# Patient Record
Sex: Male | Born: 1955 | Hispanic: No | Marital: Married | Smoking: Never smoker
Health system: Southern US, Community
[De-identification: ages and names within clinical notes are randomized; demographics above are authoritative.]

## PROBLEM LIST (undated history)

## (undated) DIAGNOSIS — K469 Unspecified abdominal hernia without obstruction or gangrene: Secondary | ICD-10-CM

## (undated) DIAGNOSIS — K08409 Partial loss of teeth, unspecified cause, unspecified class: Secondary | ICD-10-CM

---

## 2010-01-18 ENCOUNTER — Ambulatory Visit: Admit: 2010-01-18 | Payer: Self-pay | Admitting: Internal Medicine

## 2011-02-04 DIAGNOSIS — J309 Allergic rhinitis, unspecified: Secondary | ICD-10-CM | POA: Insufficient documentation

## 2011-03-26 ENCOUNTER — Ambulatory Visit: Payer: Self-pay | Admitting: Internal Medicine

## 2012-04-20 ENCOUNTER — Emergency Department (HOSPITAL_COMMUNITY)
Admission: EM | Admit: 2012-04-20 | Discharge: 2012-04-20 | Disposition: A | Discharge status: Home / Self Care | Payer: BC Managed Care – PPO | Attending: Emergency Medicine | Admitting: Emergency Medicine

## 2012-04-20 ENCOUNTER — Encounter (HOSPITAL_COMMUNITY): Payer: Self-pay

## 2012-04-20 ENCOUNTER — Emergency Department (HOSPITAL_COMMUNITY): Payer: BC Managed Care – PPO

## 2012-04-20 DIAGNOSIS — Z79899 Other long term (current) drug therapy: Secondary | ICD-10-CM | POA: Insufficient documentation

## 2012-04-20 DIAGNOSIS — K4090 Unilateral inguinal hernia, without obstruction or gangrene, not specified as recurrent: Secondary | ICD-10-CM | POA: Insufficient documentation

## 2012-04-20 DIAGNOSIS — R0682 Tachypnea, not elsewhere classified: Secondary | ICD-10-CM | POA: Insufficient documentation

## 2012-04-20 HISTORY — DX: Unspecified abdominal hernia without obstruction or gangrene: K46.9

## 2012-04-20 HISTORY — DX: Partial loss of teeth, unspecified cause, unspecified class: K08.409

## 2012-04-20 LAB — CBC
HCT: 42.6 % (ref 39.0–52.0)
Hemoglobin: 14.5 g/dL (ref 13.0–17.0)
MCH: 31 pg (ref 26.0–34.0)
MCHC: 34 g/dL (ref 30.0–36.0)
MCV: 91 fL (ref 78.0–100.0)
Platelets: 221 10*3/uL (ref 150–400)
RBC: 4.68 MIL/uL (ref 4.22–5.81)
RDW: 13.5 % (ref 11.5–15.5)
WBC: 6.9 10*3/uL (ref 4.0–10.5)

## 2012-04-20 LAB — BASIC METABOLIC PANEL
BUN: 16 mg/dL (ref 6–23)
CO2: 30 mEq/L (ref 19–32)
Calcium: 9.2 mg/dL (ref 8.4–10.5)
Chloride: 101 mEq/L (ref 96–112)
Creatinine, Ser: 1.04 mg/dL (ref 0.50–1.35)
GFR calc Af Amer: >90 mL/min (ref >90)
GFR calc non Af Amer: 78 mL/min — ABNORMAL LOW (ref >90)
Glucose, Bld: 113 mg/dL — ABNORMAL HIGH (ref 70–99)
Potassium: 3.4 mEq/L — ABNORMAL LOW (ref 3.5–5.1)
Sodium: 137 mEq/L (ref 135–145)

## 2012-04-20 LAB — URINALYSIS, ROUTINE W REFLEX MICROSCOPIC
Leukocytes, UA: NEGATIVE
Nitrite: NEGATIVE
Protein, ur: NEGATIVE mg/dL
Urobilinogen, UA: 0.2 mg/dL (ref 0.0–1.0)

## 2012-04-20 MED ORDER — IOHEXOL 300 MG/ML  SOLN
50.0000 mL | Freq: Once | INTRAMUSCULAR | Status: AC | PRN
  Administered 2012-04-20: 50 mL via ORAL

## 2012-04-20 MED ORDER — IOHEXOL 300 MG/ML  SOLN
100.0000 mL | Freq: Once | INTRAMUSCULAR | Status: AC | PRN
  Administered 2012-04-20: 100 mL via INTRAVENOUS

## 2012-04-20 MED ORDER — HYDROCODONE-ACETAMINOPHEN 5-325 MG PO TABS: ORAL_TABLET | ORAL | Status: DC

## 2012-04-20 MED ORDER — MORPHINE SULFATE 4 MG/ML IJ SOLN: 4.0000 mg | Freq: Once | INTRAMUSCULAR | Status: DC

## 2012-04-20 NOTE — ED Notes (Signed)
PA in to eval. Swelling to right side with little pain. Able to urinate and have bowel movements without difficulty

## 2012-04-20 NOTE — ED Provider Notes (Signed)
History     CSN: [id number]  Arrival date & time 04/20/12  1458   First MD Initiated Contact with Patient 04/20/12 1500      Chief Complaint  Patient presents with  . Groin Swelling    (Consider location/radiation/quality/duration/timing/severity/associated sxs/prior treatment) The history is provided by the patient. No language interpreter was used.  Pt is a 57yo male c/o right groin swelling and pain x5 days.  Pt reports mild stomach ache on Wednesday, took pepto-bismol that day, noticed swelling in right groin that night with increased discomfort.  Describes pain as dull and achy, made worse with certain movements, 1/10 on pain scale.  Does not recall any specific injury except possibly getting out of the car with a twisting motion. Reports hx of "small" hernia of unknown location 47yrs ago but denies any other abdominal hx.  No abdominal surgeries or GI problems.  Otherwise healthy male on no daily medications.  Has tried Advil, heat, and ice for current swelling and discomfort w/o relief.  Denies fever, n/v/d, dysuria, or constipation.  Denies testicular pain or swelling.   Past Medical History  Diagnosis Date  . Wisdom teeth removed   . Hernia     does not know where. childhood hernia    History reviewed. No pertinent past surgical history.  No family history on file.  History  Substance Use Topics  . Smoking status: Never Smoker   . Smokeless tobacco: Never Used  . Alcohol Use: No      Review of Systems  Constitutional: Negative for fever and chills.  Gastrointestinal: Negative for nausea, vomiting, abdominal pain, diarrhea, constipation and abdominal distention.  Genitourinary: Negative for dysuria, flank pain, discharge, scrotal swelling, enuresis, difficulty urinating and testicular pain.       Mass-right groin     Allergies  Review of patient's allergies indicates no known allergies.  Home Medications   Current Outpatient Rx  Name  Route  Sig  Dispense   Refill  . bismuth subsalicylate (PEPTO BISMOL) 262 MG/15ML suspension   Oral   Take 30 mLs by mouth every 6 (six) hours as needed for indigestion.         . Ginkgo Biloba (GINKOBA PO)   Oral   Take 1 capsule by mouth daily.         Marland Kitchen ibuprofen (ADVIL,MOTRIN) 200 MG tablet   Oral   Take 200 mg by mouth every 6 (six) hours as needed for pain.         . Multiple Vitamin (MULTIVITAMIN WITH MINERALS) TABS   Oral   Take 1 tablet by mouth daily.         Marland Kitchen HYDROcodone-acetaminophen (NORCO/VICODIN) 5-325 MG per tablet      Take 1-2 tablets every 6-8hrs as needed for pain.   6 tablet   0     BP 102/65  Pulse 62  Temp(Src) 98.1 F (36.7 C) (Oral)  Resp 16  SpO2 100%  Physical Exam  Nursing note and vitals reviewed. Constitutional: He appears well-developed and well-nourished.  Well appearing male, resting comfortably on exam bed.   HENT:  Head: Normocephalic and atraumatic.  Eyes: Conjunctivae are normal. No scleral icterus.  Neck: Normal range of motion.  Cardiovascular: Normal rate, regular rhythm and normal heart sounds.   Pulmonary/Chest: Effort normal and breath sounds normal.  tachypnic     Abdominal: Soft. Bowel sounds are normal. He exhibits mass ( right groin). He exhibits no distension. There is tenderness (right groin, across inguinal  ligament ). There is no rebound and no guarding. A hernia is present. Hernia confirmed positive in the right inguinal area ( indurated, non-reducible  ). Hernia confirmed negative in the left inguinal area.  Genitourinary: Testes normal and penis normal.    Right testis shows no mass, no swelling and no tenderness. Right testis is descended. Cremasteric reflex is not absent on the right side. Left testis shows no mass, no swelling and no tenderness. Left testis is descended. Cremasteric reflex is not absent on the left side.  Musculoskeletal: Normal range of motion.  Lymphadenopathy:       Right: No inguinal adenopathy present.        Left: No inguinal adenopathy present.  Neurological: He is alert.  Skin: Skin is warm and dry.    ED Course  Procedures (including critical care time)  Labs Reviewed  BASIC METABOLIC PANEL - Abnormal; Notable for the following:    Potassium 3.4 (*)    Glucose, Bld 113 (*)    GFR calc non Af Amer 78 (*)    All other components within normal limits  URINALYSIS, ROUTINE W REFLEX MICROSCOPIC  CBC   Ct Abdomen Pelvis W Contrast  04/20/2012  *RADIOLOGY REPORT*  Clinical Data: Right groin pain and swelling.  CT ABDOMEN AND PELVIS WITH CONTRAST  Technique:  Multidetector CT imaging of the abdomen and pelvis was performed following the standard protocol during bolus administration of intravenous contrast.  Contrast: OMNIPAQUE IOHEXOL 300 MG/ML  SOLN  Comparison: None  Findings: The lung bases are clear.  No pleural effusion.  The solid abdominal organs are normal.  The gallbladder is normal. No common bile duct dilatation.  There is a bilobed peripelvic and parenchymal left renal cyst which measures as simple fluid.  The stomach, duodenum, small bowel and colon are unremarkable.  The appendix is normal.  No mass lesions or inflammatory changes.  The aorta is normal in caliber.  The major branch vessels are normal. No mesenteric or retroperitoneal masses or adenopathy.  The bladder, prostate gland and seminal vesicles are normal.  No pelvic mass, adenopathy or free pelvic fluid collections.  There are a few small scattered lymph nodes.  There is a right inguinal hernia containing dirty fat and fluid. This could be incarcerated fat.  No significant bony findings.  There is a moderate scoliosis.  IMPRESSION:  1.  Large right inguinal hernia containing inflamed fat and fluid. Findings suspicious for incarceration.  It does not contain any bowel or scrotal contents. 2.  No other significant abdominal/pelvic findings.   Original Report Authenticated By: Rudie Meyer, M.D.      1. Right inguinal  hernia       MDM  Pt c/o right groin swelling and discomfort x5 days.  Hx of small hernia 61yrs ago but no abdominal surgeries or GI problems.  Denies fever, n/v/d, dysuria, constipation, or testicular pain.  Otherwise healthy male.  PE: abd: soft, non-distended, nl bowel sounds, TTP right groin with visible and palpable mass- indurated and non-reducible.  No testicular swelling or mass.   Concern for direct hernia.  Will obtain basic labs, abdominal CT, give morphine and make pt NPO.  Last ate some cereal earlier this morning.  General surgery consult put in by Dr. Manus Gunning.   UA: nl BMP: mild hypokalemia  CBC: nl  CT: large right inguinal hernia containing inflamed fat and fluid.  Does not contain any bowel or scrotal contents.    Will discharge pt home with  watchful waiting, likely need surgical repair down the road.  Advised pt to seek immediate medical attention should area become severely tender, pain radiates into testicles, notice mass in testicle, or develop fever and nausea with vomiting.  Will have pt f/u with [facility] for elective surgery.    Rx: norco  Provided pt with education packet on hernias.   Vitals: unremarkable. Discharged in stable condition.    Discussed pt with attending during ED encounter.      Junius Finner, PA-C 04/20/12 1810

## 2012-04-20 NOTE — ED Provider Notes (Signed)
Medical screening examination/treatment/procedure(s) were conducted as a shared visit with non-physician practitioner(s) and myself.  I personally evaluated the patient during the encounter  5 days of right groin pain with painful nodule. Incarcerated inguinal hernia on exam. No testicular pain. No abdominal pain. No bowel seen on imaging. Discussed with Dr. Magnus Ivan who agrees with pain control and outpatient followup.  Glynn Octave, MD 04/20/12 2325

## 2012-04-20 NOTE — ED Notes (Signed)
Patient woke up 5 days ago with stomach pain. Took pepto-bismal which helped. Went to work that day, came home and was having pain in right groin then wife noticed swelling about 2 hours later.

## 2014-10-03 ENCOUNTER — Emergency Department (HOSPITAL_COMMUNITY): Payer: BC Managed Care – PPO

## 2014-10-03 ENCOUNTER — Emergency Department (HOSPITAL_COMMUNITY)
Admission: EM | Admit: 2014-10-03 | Discharge: 2014-10-03 | Disposition: A | Discharge status: Home / Self Care | Payer: BC Managed Care – PPO | Attending: Emergency Medicine | Admitting: Emergency Medicine

## 2014-10-03 ENCOUNTER — Encounter (HOSPITAL_COMMUNITY): Payer: Self-pay | Admitting: *Deleted

## 2014-10-03 DIAGNOSIS — S90932A Unspecified superficial injury of left great toe, initial encounter: Secondary | ICD-10-CM | POA: Diagnosis not present

## 2014-10-03 DIAGNOSIS — Y998 Other external cause status: Secondary | ICD-10-CM | POA: Diagnosis not present

## 2014-10-03 DIAGNOSIS — Y9289 Other specified places as the place of occurrence of the external cause: Secondary | ICD-10-CM | POA: Insufficient documentation

## 2014-10-03 DIAGNOSIS — Y9389 Activity, other specified: Secondary | ICD-10-CM | POA: Diagnosis not present

## 2014-10-03 DIAGNOSIS — L02612 Cutaneous abscess of left foot: Secondary | ICD-10-CM

## 2014-10-03 DIAGNOSIS — S99922A Unspecified injury of left foot, initial encounter: Secondary | ICD-10-CM

## 2014-10-03 DIAGNOSIS — X58XXXA Exposure to other specified factors, initial encounter: Secondary | ICD-10-CM | POA: Insufficient documentation

## 2014-10-03 DIAGNOSIS — Z79899 Other long term (current) drug therapy: Secondary | ICD-10-CM | POA: Insufficient documentation

## 2014-10-03 MED ORDER — CEPHALEXIN 500 MG PO CAPS: 500.0000 mg | ORAL_CAPSULE | Freq: Four times a day (QID) | ORAL | Status: DC

## 2014-10-03 NOTE — Discharge Instructions (Signed)
Abscess An abscess (boil or furuncle) is an infected area on or under the skin. This area is filled with yellowish-white fluid (pus) and other material (debris). HOME CARE   Only take medicines as told by your doctor.  If you were given antibiotic medicine, take it as directed. Finish the medicine even if you start to feel better.  If gauze is used, follow your doctor's directions for changing the gauze.  To avoid spreading the infection:  Keep your abscess covered with a bandage.  Wash your hands well.  Do not share personal care items, towels, or whirlpools with others.  Avoid skin contact with others.  Keep your skin and clothes clean around the abscess.  Keep all doctor visits as told. GET HELP RIGHT AWAY IF:   You have more pain, puffiness (swelling), or redness in the wound site.  You have more fluid or blood coming from the wound site.  You have muscle aches, chills, or you feel sick.  You have a fever. MAKE SURE YOU:  Cellulitis Cellulitis is an infection of the skin and the tissue under the skin. The infected area is usually red and tender. This happens most often in the arms and lower legs. HOME CARE   Take your antibiotic medicine as told. Finish the medicine even if you start to feel better.  Keep the infected arm or leg raised (elevated).  Put a warm cloth on the area up to 4 times per day.  Only take medicines as told by your doctor.  Keep all doctor visits as told. GET HELP IF:  You see red streaks on the skin coming from the infected area.  Your red area gets bigger or turns a dark color.  Your bone or joint under the infected area is painful after the skin heals.  Your infection comes back in the same area or different area.  You have a puffy (swollen) bump in the infected area.  You have new symptoms.  You have a fever. GET HELP RIGHT AWAY IF:   You feel very sleepy.  You throw up (vomit) or have watery poop (diarrhea).  You feel  sick and have muscle aches and pains. MAKE SURE YOU:   Understand these instructions.  Will watch your condition.  Will get help right away if you are not doing well or get worse. Document Released: 06/06/2007 Document Revised: 05/04/2013 Document Reviewed: 03/05/2011 [facility] Patient Information 2015 [place], [place]. This information is not intended to replace advice given to you by your health care provider. Make sure you discuss any questions you have with your health care provider.    Understand these instructions.  Will watch your condition.  Will get help right away if you are not doing well or get worse. Document Released: 06/06/2007 Document Revised: 06/19/2011 Document Reviewed: 03/02/2011 [facility] Patient Information 2015 [place], [place]. This information is not intended to replace advice given to you by your health care provider. Make sure you discuss any questions you have with your health care provider.

## 2014-10-03 NOTE — ED Notes (Signed)
Declined W/C at D/C and was escorted to lobby by RN. 
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
## 2016-04-16 DIAGNOSIS — Z840 Family history of diseases of the skin and subcutaneous tissue: Secondary | ICD-10-CM | POA: Insufficient documentation

## 2016-04-16 DIAGNOSIS — Z8619 Personal history of other infectious and parasitic diseases: Secondary | ICD-10-CM | POA: Insufficient documentation
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
## 2020-01-29 ENCOUNTER — Encounter: Payer: Self-pay | Admitting: Family Medicine

## 2021-02-17 ENCOUNTER — Ambulatory Visit
Admission: EM | Admit: 2021-02-17 | Discharge: 2021-02-17 | Disposition: A | Discharge status: Home / Self Care | Payer: BC Managed Care – PPO | Attending: Physician Assistant | Admitting: Physician Assistant

## 2021-02-17 ENCOUNTER — Other Ambulatory Visit: Payer: Self-pay

## 2021-02-17 DIAGNOSIS — R238 Other skin changes: Secondary | ICD-10-CM

## 2021-02-17 NOTE — ED Triage Notes (Signed)
Pt c/o skin condition to lateral side of right calf. Onset ~ 1 year ago.

## 2021-02-17 NOTE — ED Provider Notes (Signed)
[facility]    CSN: [id number] Arrival date & time: 02/17/21  1646      History   Chief Complaint Chief Complaint  Patient presents with   skin condition    HPI Kenneth Mccarthy is a 66 y.o. male.   Patient here today for evaluation of patches of dry skin to right lateral lower leg that have been present for at least a year.  He denies that areas are painful.  They itch if he scratches them but otherwise are not concerning to patient.  He does not currently have primary care provider.  The history is provided by the patient.   Past Medical History:  Diagnosis Date   Hernia    does not know where. childhood hernia   Wisdom teeth removed     There are no problems to display for this patient.   History reviewed. No pertinent surgical history.     Home Medications    Prior to Admission medications   Medication Sig Start Date End Date Taking? Authorizing Provider  bismuth subsalicylate (PEPTO BISMOL) 262 MG/15ML suspension Take 30 mLs by mouth every 6 (six) hours as needed for indigestion.    [provider]  cephALEXin (KEFLEX) 500 MG capsule Take 1 capsule (500 mg total) by mouth 4 (four) times daily. 10/03/14   Danelle Berry, PA-C  Ginkgo Biloba (GINKOBA PO) Take 1 capsule by mouth daily.    [provider]  HYDROcodone-acetaminophen (NORCO/VICODIN) 5-325 MG per tablet Take 1-2 tablets every 6-8hrs as needed for pain. 04/20/12   Lurene Shadow, PA-C  ibuprofen (ADVIL,MOTRIN) 200 MG tablet Take 200 mg by mouth every 6 (six) hours as needed for pain.    [provider]  Multiple Vitamin (MULTIVITAMIN WITH MINERALS) TABS Take 1 tablet by mouth daily.    [provider]    Family History History reviewed. No pertinent family history.  Social History Social History   Tobacco Use   Smoking status: Never   Smokeless tobacco: Never  Substance Use Topics   Alcohol use: No   Drug use: No     Allergies   Patient has no  known allergies.   Review of Systems Review of Systems  Constitutional:  Negative for chills and fever.  Eyes:  Negative for discharge and redness.  Skin:  Positive for color change. Negative for wound.       Two (4-5 cm) annular areas of purple discoloration with dry scaling skin to left lateral leg, varicose veins noted, no significant edema    Physical Exam Triage Vital Signs ED Triage Vitals  Enc Vitals Group     BP 02/17/21 1732 135/82     Pulse Rate 02/17/21 1732 79     Resp 02/17/21 1732 18     Temp 02/17/21 1732 98.1 F (36.7 C)     Temp Source 02/17/21 1732 Oral     SpO2 02/17/21 1732 95 %     Weight --      Height --      Head Circumference --      Peak Flow --      Pain Score 02/17/21 1735 0     Pain Loc --      Pain Edu? --      Excl. in GC? --    No data found.  Updated Vital Signs BP 135/82 (BP Location: Left Arm)    Pulse 79    Temp 98.1 F (36.7 C) (Oral)    Resp  18    SpO2 95%     Physical Exam Vitals and nursing note reviewed.  Constitutional:      General: He is not in acute distress.    Appearance: Normal appearance. He is not ill-appearing.  HENT:     Head: Normocephalic and atraumatic.  Eyes:     Conjunctiva/sclera: Conjunctivae normal.  Cardiovascular:     Rate and Rhythm: Normal rate.  Pulmonary:     Effort: Pulmonary effort is normal.  Neurological:     Mental Status: He is alert.  Psychiatric:        Mood and Affect: Mood normal.        Behavior: Behavior normal.        Thought Content: Thought content normal.     UC Treatments / Results  Labs (all labs ordered are listed, but only abnormal results are displayed) Labs Reviewed  CBC WITH DIFFERENTIAL/PLATELET  COMPREHENSIVE METABOLIC PANEL    EKG   Radiology No results found.  Procedures Procedures (including critical care time)  Medications Ordered in UC Medications - No data to display  Initial Impression / Assessment and Plan / UC Course  I have reviewed the  triage vital signs and the nursing notes.  Pertinent labs & imaging results that were available during my care of the patient were reviewed by me and considered in my medical decision making (see chart for details).    Concerns for venous stasis changes- recommended further evaluation by PCP. Appointment made in office to establish care with same. Routine labs ordered. Encouraged follow up with any further concerns.   Final Clinical Impressions(s) / UC Diagnoses   Final diagnoses:  Other skin changes   Discharge Instructions   None    ED Prescriptions   None    PDMP not reviewed this encounter.   Tomi Bamberger, PA-C 02/17/21 1806

## 2021-02-18 LAB — COMPREHENSIVE METABOLIC PANEL
ALT: 29 IU/L (ref 0–44)
AST: 23 IU/L (ref 0–40)
Albumin/Globulin Ratio: 1.8 (ref 1.2–2.2)
Albumin: 4.7 g/dL (ref 3.8–4.8)
Alkaline Phosphatase: 88 IU/L (ref 44–121)
BUN/Creatinine Ratio: 16 (ref 10–24)
BUN: 17 mg/dL (ref 8–27)
Bilirubin Total: 0.3 mg/dL (ref 0.0–1.2)
CO2: 22 mmol/L (ref 20–29)
Calcium: 10.5 mg/dL — ABNORMAL HIGH (ref 8.6–10.2)
Chloride: 100 mmol/L (ref 96–106)
Creatinine, Ser: 1.04 mg/dL (ref 0.76–1.27)
Globulin, Total: 2.6 g/dL (ref 1.5–4.5)
Glucose: 79 mg/dL (ref 70–99)
Potassium: 4.1 mmol/L (ref 3.5–5.2)
Sodium: 138 mmol/L (ref 134–144)
Total Protein: 7.3 g/dL (ref 6.0–8.5)
eGFR: 79 mL/min/{1.73_m2} (ref >59)

## 2021-02-18 LAB — CBC WITH DIFFERENTIAL/PLATELET
Basophils Absolute: 0.1 10*3/uL (ref 0.0–0.2)
Basos: 1 %
EOS (ABSOLUTE): 0.3 10*3/uL (ref 0.0–0.4)
Eos: 4 %
Hematocrit: 45 % (ref 37.5–51.0)
Hemoglobin: 14.9 g/dL (ref 13.0–17.7)
Immature Grans (Abs): 0 10*3/uL (ref 0.0–0.1)
Immature Granulocytes: 0 %
Lymphocytes Absolute: 1.7 10*3/uL (ref 0.7–3.1)
Lymphs: 24 %
MCH: 30.4 pg (ref 26.6–33.0)
MCHC: 33.1 g/dL (ref 31.5–35.7)
MCV: 92 fL (ref 79–97)
Monocytes Absolute: 0.4 10*3/uL (ref 0.1–0.9)
Monocytes: 6 %
Neutrophils Absolute: 4.5 10*3/uL (ref 1.4–7.0)
Neutrophils: 65 %
Platelets: 261 10*3/uL (ref 150–450)
RBC: 4.9 x10E6/uL (ref 4.14–5.80)
RDW: 13.1 % (ref 11.6–15.4)
WBC: 7 10*3/uL (ref 3.4–10.8)

## 2021-04-13 ENCOUNTER — Ambulatory Visit: Payer: BC Managed Care – PPO | Admitting: Family Medicine

## 2021-06-20 ENCOUNTER — Encounter: Payer: Self-pay | Admitting: Family Medicine

## 2021-06-20 ENCOUNTER — Ambulatory Visit: Payer: BC Managed Care – PPO | Admitting: Family Medicine

## 2021-06-20 VITALS — BP 131/80 | HR 85 | Temp 98.1°F | Resp 16 | Ht 71.0 in | Wt 201.2 lb

## 2021-06-20 DIAGNOSIS — M79604 Pain in right leg: Secondary | ICD-10-CM

## 2021-06-20 DIAGNOSIS — R238 Other skin changes: Secondary | ICD-10-CM | POA: Diagnosis not present

## 2021-06-20 DIAGNOSIS — Z7689 Persons encountering health services in other specified circumstances: Secondary | ICD-10-CM | POA: Diagnosis not present

## 2021-06-20 NOTE — Progress Notes (Signed)
Patient said she has a glancing pain in his head at times.,  Patient is new to practice to established care.,

## 2021-06-22 ENCOUNTER — Encounter: Payer: Self-pay | Admitting: Family Medicine

## 2021-07-13 ENCOUNTER — Other Ambulatory Visit: Payer: Self-pay | Admitting: *Deleted

## 2021-07-13 DIAGNOSIS — M25561 Pain in right knee: Secondary | ICD-10-CM

## 2021-07-26 NOTE — Progress Notes (Signed)
Office Note     CC:  RLE pain  Requesting Provider:  Dorna Mai, MD  HPI: Magdiel Nutting is a 66 y.o. (November 23, 1955) male presenting at the request of .Dorna Mai, MD for right lower extremity pain.  On exam, Neyo was doing well.  A [place] native, he graduated from [employer].  He now works for [employer] where he is on his feet for the majority of the day.  Kesan first noted the right lateral leg lesion over a month ago.  He denies pain, ulceration, burning.  No history of varicosities.  Caroline does appreciate some bilateral lower extremity swelling by days and which is improved with the use of compression stockings.  He denies symptoms of claudication, ischemic rest pain, tissue loss.  No previous vein procedures, no previous DVT.   Past Medical History:  Diagnosis Date   Hernia    does not know where. childhood hernia   Wisdom teeth removed     No past surgical history on file.  Social History   Socioeconomic History   Marital status: Married    Spouse name: Not on file   Number of children: Not on file   Years of education: Not on file   Highest education level: Not on file  Occupational History   Not on file  Tobacco Use   Smoking status: Never   Smokeless tobacco: Never  Substance and Sexual Activity   Alcohol use: No   Drug use: No   Sexual activity: Yes  Other Topics Concern   Not on file  Social History Narrative   Not on file   Social Determinants of Health   Financial Resource Strain: Not on file  Food Insecurity: Not on file  Transportation Needs: Not on file  Physical Activity: Not on file  Stress: Not on file  Social Connections: Not on file  Intimate Partner Violence: Not on file   No family history on file.  Current Outpatient Medications  Medication Sig Dispense Refill   Ascorbic Acid 1500 MG TBCR Take by mouth.     bismuth subsalicylate (PEPTO BISMOL) 262 MG/15ML suspension Take 30 mLs by mouth every 6 (six) hours  as needed for indigestion.     cephALEXin (KEFLEX) 500 MG capsule Take 1 capsule (500 mg total) by mouth 4 (four) times daily. (Patient not taking: Reported on 06/20/2021) 20 capsule 0   cyanocobalamin ([employer] VITAMIN B12) 1000 MCG tablet Take by mouth.     fluticasone (FLONASE) 50 MCG/ACT nasal spray 1 spray by Both Nostrils route daily.     Ginkgo Biloba (GINKOBA PO) Take 1 capsule by mouth daily.     HYDROcodone-acetaminophen (NORCO/VICODIN) 5-325 MG per tablet Take 1-2 tablets every 6-8hrs as needed for pain. (Patient not taking: Reported on 06/20/2021) 6 tablet 0   ibuprofen (ADVIL) 200 MG tablet Take by mouth.     Multiple Vitamin (MULTIVITAMIN WITH MINERALS) TABS Take 1 tablet by mouth daily.     Omega-3 Fatty Acids (FISH OIL) 1000 MG CAPS Take by mouth.     No current facility-administered medications for this visit.    No Known Allergies   REVIEW OF SYSTEMS:   [X]  denotes positive finding, [ ]  denotes negative finding Cardiac  Comments:  Chest pain or chest pressure:    Shortness of breath upon exertion:    Short of breath when lying flat:    Irregular heart rhythm:        Vascular    Pain in calf,  thigh, or hip brought on by ambulation:    Pain in feet at night that wakes you up from your sleep:     Blood clot in your veins:    Leg swelling:         Pulmonary    Oxygen at home:    Productive cough:     Wheezing:         Neurologic    Sudden weakness in arms or legs:     Sudden numbness in arms or legs:     Sudden onset of difficulty speaking or slurred speech:    Temporary loss of vision in one eye:     Problems with dizziness:         Gastrointestinal    Blood in stool:     Vomited blood:         Genitourinary    Burning when urinating:     Blood in urine:        Psychiatric    Major depression:         Hematologic    Bleeding problems:    Problems with blood clotting too easily:        Skin    Rashes or ulcers:        Constitutional    Fever or  chills:      PHYSICAL EXAMINATION:  There were no vitals filed for this visit.  General:  WDWN in NAD; vital signs documented above Gait: Not observed HENT: WNL, normocephalic Pulmonary: normal non-labored breathing , without wheezing Cardiac: regular HR Abdomen: soft, NT, no masses Skin: without rashes Vascular Exam/Pulses:  Right Left  Radial 2+ (normal) 2+ (normal)  Ulnar    Femoral    Popliteal    DP 2+ (normal) 2+ (normal)  PT 2+ (normal) 2+ (normal)   Extremities: without ischemic changes, without Gangrene , without cellulitis; without open wounds;  Musculoskeletal: no muscle wasting or atrophy  Neurologic: A&O X 3;  No focal weakness or paresthesias are detected Psychiatric:  The pt has Normal affect.   Non-Invasive Vascular Imaging:   ABI Findings:  +---------+------------------+-----+---------+--------+  Right    Rt Pressure (mmHg)IndexWaveform Comment   +---------+------------------+-----+---------+--------+  Brachial 142                                       +---------+------------------+-----+---------+--------+  PTA      155               1.09 triphasic          +---------+------------------+-----+---------+--------+  DP       162               1.14 triphasic          +---------+------------------+-----+---------+--------+  Great Toe115               0.81 Normal             +---------+------------------+-----+---------+--------+   +---------+------------------+-----+---------+-------+  Left     Lt Pressure (mmHg)IndexWaveform Comment  +---------+------------------+-----+---------+-------+  Brachial 142                                      +---------+------------------+-----+---------+-------+  PTA      171               1.20 triphasic         +---------+------------------+-----+---------+-------+  DP       159               1.12 triphasic         +---------+------------------+-----+---------+-------+   Great Toe135               0.95 Normal            +---------+------------------+-----+---------+-------+   +-------+-----------+-----------+------------+------------+  ABI/TBIToday's ABIToday's TBIPrevious ABIPrevious TBI  +-------+-----------+-----------+------------+------------+  Right  1.14       0.81                                 +-------+-----------+-----------+------------+------------+  Left   1.20       0.95                                 +-------+-----------+-----------+------------+------------+     ASSESSMENT/PLAN: Arless Vineyard is a 66 y.o. male presenting with what appears to be dermal skin changes at the site of perforator vessels.  Bilateral lower extremity ABIs reviewed-these were normal.  On physical exam, Jama was noted to have palpable pulses in his feet with hair loss in the calves.  The skin distribution of the hyperpigmentation is most consistent with chronic venous insufficiency, localized to perforator vessels.  He denies ulceration, bleeding.  No varicose veins appreciated.  I think Kobi would benefit from dermatology referral to ensure this is simply dermal hyperpigmentation from venous insufficiency, and not another diagnosis.  We had a long conversation regarding the above.  We discussed venous insufficiency and symptoms such as leg swelling that usually occur with this diagnosis.  Fortunately, Avram is already wearing compression stockings.  We discussed transitioning to 20 to 30 mmHg knee-high compression stockings.  He was measured in our office today.  I asked that he return to my office should his right lower extremity symptoms worsen, to discuss therapeutic options.  At this time, he would be best served with continued medical management.  If this is focal dermal hyperpigmentation from venous insufficiency, it may or may not resolve over the course of several months.   Victorino Sparrow, MD [facility] [phone]

## 2021-07-28 ENCOUNTER — Ambulatory Visit: Payer: BC Managed Care – PPO | Admitting: Vascular Surgery

## 2021-07-28 ENCOUNTER — Ambulatory Visit (HOSPITAL_COMMUNITY)
Admission: RE | Admit: 2021-07-28 | Discharge: 2021-07-28 | Disposition: A | Discharge status: Home / Self Care | Payer: BC Managed Care – PPO | Source: Ambulatory Visit | Attending: Vascular Surgery | Admitting: Vascular Surgery

## 2021-07-28 ENCOUNTER — Encounter: Payer: Self-pay | Admitting: Vascular Surgery

## 2021-07-28 VITALS — BP 134/85 | HR 63 | Temp 97.9°F | Resp 20 | Ht 71.0 in | Wt 198.0 lb

## 2021-07-28 DIAGNOSIS — L819 Disorder of pigmentation, unspecified: Secondary | ICD-10-CM | POA: Diagnosis not present

## 2021-07-28 DIAGNOSIS — M25561 Pain in right knee: Secondary | ICD-10-CM | POA: Diagnosis present

## 2021-10-26 ENCOUNTER — Encounter: Payer: Self-pay | Admitting: Emergency Medicine

## 2021-10-26 ENCOUNTER — Ambulatory Visit
Admission: EM | Admit: 2021-10-26 | Discharge: 2021-10-26 | Disposition: A | Discharge status: Home / Self Care | Payer: BC Managed Care – PPO | Attending: Internal Medicine | Admitting: Internal Medicine

## 2021-10-26 ENCOUNTER — Other Ambulatory Visit: Payer: Self-pay

## 2021-10-26 DIAGNOSIS — Z20822 Contact with and (suspected) exposure to covid-19: Secondary | ICD-10-CM | POA: Diagnosis not present

## 2021-10-26 NOTE — ED Triage Notes (Signed)
Pt here for exposure to covid from his wife that tested positive yesterday

## 2021-10-26 NOTE — Discharge Instructions (Signed)
Your COVID test is pending.  We will call if it is positive. 

## 2021-10-26 NOTE — ED Provider Notes (Signed)
[facility]    CSN: [id number] Arrival date & time: 10/26/21  1612      History   Chief Complaint Chief Complaint  Patient presents with   Covid Exposure    HPI Kenneth Mccarthy is a 66 y.o. male.   Patient presents for COVID testing given close exposure to COVID-19.  Patient reports that his wife tested positive for COVID-19 yesterday.  Patient denies that he has any associated symptoms including nasal congestion, runny nose, cough, fever.     Past Medical History:  Diagnosis Date   Hernia    does not know where. 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 No pertinent surgical history.     Home Medications    Prior to Admission medications   Medication Sig Start Date End Date Taking? Authorizing Provider  Ascorbic Acid 1500 MG TBCR Take by mouth.    [provider]  bismuth subsalicylate (PEPTO BISMOL) 262 MG/15ML suspension Take 30 mLs by mouth every 6 (six) hours as needed for indigestion.    [provider]  cyanocobalamin ([employer]S VITAMIN B12) 1000 MCG tablet Take by mouth.    [provider]  fluticasone (FLONASE) 50 MCG/ACT nasal spray 1 spray by Both Nostrils route daily.    [provider]  Ginkgo Biloba (GINKOBA PO) Take 1 capsule by mouth daily.    [provider]  ibuprofen (ADVIL) 200 MG tablet Take by mouth.    [provider]  Multiple Vitamin (MULTIVITAMIN WITH MINERALS) TABS Take 1 tablet by mouth daily.    [provider]  Omega-3 Fatty Acids (FISH OIL) 1000 MG CAPS Take by mouth.    [provider]    Family History History reviewed. No pertinent family history.  Social History Social History   Tobacco Use   Smoking status: Never   Smokeless  tobacco: Never  Substance Use Topics   Alcohol use: No   Drug use: No     Allergies   Patient has no known allergies.   Review of Systems Review of Systems Per HPI  Physical Exam Triage Vital Signs ED Triage Vitals [10/26/21 1743]  Enc Vitals Group     BP (!) 154/97     Pulse Rate 77     Resp 18     Temp 97.9 F (36.6 C)     Temp Source Oral     SpO2 98 %     Weight      Height      Head Circumference      Peak Flow      Pain Score 0     Pain Loc      Pain Edu?      Excl. in GC?    No data found.  Updated Vital Signs BP (!) 154/97 (BP Location: Left Arm)   Pulse 77   Temp 97.9 F (36.6 C) (Oral)   Resp 18   SpO2 98%   Visual Acuity Right Eye Distance:   Left Eye Distance:   Bilateral Distance:    Right Eye Near:   Left Eye Near:    Bilateral Near:     Physical Exam Constitutional:      General: He is not in acute distress.    Appearance:  Normal appearance. He is not toxic-appearing or diaphoretic.  HENT:     Head: Normocephalic and atraumatic.     Right Ear: Tympanic membrane and ear canal normal.     Left Ear: Tympanic membrane and ear canal normal.     Nose: No congestion.     Mouth/Throat:     Mouth: Mucous membranes are moist.     Pharynx: No posterior oropharyngeal erythema.  Eyes:     Extraocular Movements: Extraocular movements intact.     Conjunctiva/sclera: Conjunctivae normal.     Pupils: Pupils are equal, round, and reactive to light.  Cardiovascular:     Rate and Rhythm: Normal rate and regular rhythm.     Pulses: Normal pulses.     Heart sounds: Normal heart sounds.  Pulmonary:     Effort: Pulmonary effort is normal. No respiratory distress.     Breath sounds: Normal breath sounds. No stridor. No wheezing, rhonchi or rales.  Abdominal:     General: Abdomen is flat. Bowel sounds are normal.     Palpations: Abdomen is soft.  Musculoskeletal:        General: Normal range of motion.     Cervical back: Normal range of motion.   Skin:    General: Skin is warm and dry.  Neurological:     General: No focal deficit present.     Mental Status: He is alert and oriented to person, place, and time. Mental status is at baseline.  Psychiatric:        Mood and Affect: Mood normal.        Behavior: Behavior normal.        Thought Content: Thought content normal.        Judgment: Judgment normal.      UC Treatments / Results  Labs (all labs ordered are listed, but only abnormal results are displayed) Labs Reviewed  SARS CORONAVIRUS 2 (TAT 6-24 HRS)    EKG   Radiology No results found.  Procedures Procedures (including critical care time)  Medications Ordered in UC Medications - No data to display  Initial Impression / Assessment and Plan / UC Course  I have reviewed the triage vital signs and the nursing notes.  Pertinent labs & imaging results that were available during my care of the patient were reviewed by me and considered in my medical decision making (see chart for details).     Patient has had COVID exposure but is asymptomatic.  Physical exam is benign.  COVID PCR pending.  Advised patient of supportive care if symptoms develop.  Discussed return precautions.  Patient verbalized understanding and was agreeable with plan. Final Clinical Impressions(s) / UC Diagnoses   Final diagnoses:  Close exposure to COVID-19 virus  Encounter for laboratory testing for COVID-19 virus     Discharge Instructions      Your COVID test is pending.  We will call if it is positive.    ED Prescriptions   None    PDMP not reviewed this encounter.   Teodora Medici, [place] 10/26/21 1758

## 2021-10-27 LAB — SARS CORONAVIRUS 2 (TAT 6-24 HRS): SARS Coronavirus 2: POSITIVE — AB

## 2021-10-29 ENCOUNTER — Telehealth: Payer: Self-pay | Admitting: Emergency Medicine

## 2021-10-29 MED ORDER — PAXLOVID (150/100) 10 X 150 MG & 10 X 100MG PO TBPK: 3.0000 | ORAL_TABLET | Freq: Two times a day (BID) | ORAL | 0 refills | Status: AC

## 2021-10-29 NOTE — Telephone Encounter (Signed)
Patient tested positive for COVID-19.  EGFR greater than 59.  Paxlovid sent to pharmacy. 

## 2021-12-20 ENCOUNTER — Ambulatory Visit: Payer: BC Managed Care – PPO | Admitting: Emergency Medicine

## 2022-09-05 ENCOUNTER — Emergency Department (HOSPITAL_BASED_OUTPATIENT_CLINIC_OR_DEPARTMENT_OTHER)
Admission: EM | Admit: 2022-09-05 | Discharge: 2022-09-05 | Disposition: A | Discharge status: Home / Self Care | Payer: BC Managed Care – PPO | Attending: Emergency Medicine | Admitting: Emergency Medicine

## 2022-09-05 ENCOUNTER — Ambulatory Visit
Admission: EM | Admit: 2022-09-05 | Discharge: 2022-09-05 | Disposition: A | Discharge status: Home / Self Care | Payer: BC Managed Care – PPO | Attending: Physician Assistant | Admitting: Physician Assistant

## 2022-09-05 ENCOUNTER — Encounter (HOSPITAL_BASED_OUTPATIENT_CLINIC_OR_DEPARTMENT_OTHER): Payer: Self-pay | Admitting: Emergency Medicine

## 2022-09-05 ENCOUNTER — Emergency Department (HOSPITAL_BASED_OUTPATIENT_CLINIC_OR_DEPARTMENT_OTHER): Payer: BC Managed Care – PPO | Admitting: Radiology

## 2022-09-05 ENCOUNTER — Other Ambulatory Visit: Payer: Self-pay

## 2022-09-05 DIAGNOSIS — R000 Tachycardia, unspecified: Secondary | ICD-10-CM

## 2022-09-05 DIAGNOSIS — R531 Weakness: Secondary | ICD-10-CM | POA: Insufficient documentation

## 2022-09-05 DIAGNOSIS — Z1152 Encounter for screening for COVID-19: Secondary | ICD-10-CM | POA: Insufficient documentation

## 2022-09-05 DIAGNOSIS — E860 Dehydration: Secondary | ICD-10-CM | POA: Insufficient documentation

## 2022-09-05 DIAGNOSIS — R059 Cough, unspecified: Secondary | ICD-10-CM | POA: Diagnosis present

## 2022-09-05 LAB — TROPONIN I (HIGH SENSITIVITY): Troponin I (High Sensitivity): 8 ng/L (ref <18)

## 2022-09-05 LAB — CBC
HCT: 43.1 % (ref 39.0–52.0)
Hemoglobin: 14.7 g/dL (ref 13.0–17.0)
MCH: 30.8 pg (ref 26.0–34.0)
MCHC: 34.1 g/dL (ref 30.0–36.0)
MCV: 90.2 fL (ref 80.0–100.0)
Platelets: 205 10*3/uL (ref 150–400)
RBC: 4.78 MIL/uL (ref 4.22–5.81)
RDW: 13.9 % (ref 11.5–15.5)
WBC: 6.9 10*3/uL (ref 4.0–10.5)
nRBC: 0 % (ref 0.0–0.2)

## 2022-09-05 LAB — BASIC METABOLIC PANEL
Anion gap: 15 (ref 5–15)
BUN: 17 mg/dL (ref 8–23)
CO2: 19 mmol/L — ABNORMAL LOW (ref 22–32)
Calcium: 8.7 mg/dL — ABNORMAL LOW (ref 8.9–10.3)
Chloride: 101 mmol/L (ref 98–111)
Creatinine, Ser: 1.31 mg/dL — ABNORMAL HIGH (ref 0.61–1.24)
GFR, Estimated: 59 mL/min — ABNORMAL LOW (ref >60)
Glucose, Bld: 141 mg/dL — ABNORMAL HIGH (ref 70–99)
Potassium: 3.7 mmol/L (ref 3.5–5.1)
Sodium: 135 mmol/L (ref 135–145)

## 2022-09-05 LAB — URINALYSIS, ROUTINE W REFLEX MICROSCOPIC
Bilirubin Urine: NEGATIVE
Glucose, UA: NEGATIVE mg/dL
Hgb urine dipstick: NEGATIVE
Ketones, ur: NEGATIVE mg/dL
Leukocytes,Ua: NEGATIVE
Nitrite: NEGATIVE
Protein, ur: NEGATIVE mg/dL
Specific Gravity, Urine: 1.009 (ref 1.005–1.030)
pH: 6.5 (ref 5.0–8.0)

## 2022-09-05 LAB — SARS CORONAVIRUS 2 BY RT PCR: SARS Coronavirus 2 by RT PCR: NEGATIVE

## 2022-09-05 MED ORDER — LACTATED RINGERS IV BOLUS
1000.0000 mL | Freq: Once | INTRAVENOUS | Status: AC
  Administered 2022-09-05: 1000 mL via INTRAVENOUS

## 2022-09-05 NOTE — ED Notes (Signed)
ED Provider at bedside. 

## 2022-09-05 NOTE — ED Notes (Signed)
Patient is being discharged from the Urgent Care and sent to the Emergency Department via Private Vehicle (Family Member) . Per R. Izola Price PA C, patient is in need of higher level of care due to Complexity of Care. Patient is aware and verbalizes understanding of plan of care.  Vitals:   09/05/22 1624 09/05/22 1627  BP: 120/65   Pulse: (!) 118 (!) 114  Resp: 18 20  Temp: 98.7 F (37.1 C)   SpO2: 95% 94%

## 2022-09-05 NOTE — Discharge Instructions (Signed)
It was our pleasure to provide your ER care today - we hope that you feel better.  From today's labs, it appears you may have been mildly dehydrated. Your blood sugar was mildly high, and kidney tests mildly elevated - follow up closely with primary care doctor.   Return to ER if worse, new symptoms, fevers, chest pain, trouble breathing, new or severe abdominal pain, weak/fainting, or other concern.

## 2022-09-05 NOTE — ED Triage Notes (Signed)
Pt arrives to ED from UC with c/o tachycardia. HR 120s. Pt notes that he has had a decreased level of energy and fatigue after getting 3 vaccinations 2-3 days ago.

## 2022-09-05 NOTE — ED Provider Notes (Signed)
[place] EMERGENCY DEPARTMENT AT [facility] Provider Note   CSN: [id number] Arrival date & time: 09/05/22  1738     History  Chief Complaint  Patient presents with   Tachycardia    Kenneth Mccarthy is a 67 y.o. male.  Pt with c/o non prod cough, fatigue, weakness, decreased energy in past few days.  States others in spouses SNF w covid. Denies sore throat. No trouble breathing or swallowing. Is eating normally (has [employer] in room). No abd pain or nvd. No dysuria. No headache. No neck pain/stiffness. No chest pain. No sob. No extremity pain or swelling. Went to urgent care and was noted to have sinus tachycardia and sent to ED. Denies blood loss or melena. No fainting. No chest discomfort. No unusual doe.   The history is provided by the patient and medical records.       Home Medications Prior to Admission medications   Medication Sig Start Date End Date Taking? Authorizing Provider  Ascorbic Acid 1500 MG TBCR Take by mouth.    [provider]  bismuth subsalicylate (PEPTO BISMOL) 262 MG/15ML suspension Take 30 mLs by mouth every 6 (six) hours as needed for indigestion.    [provider]  cyanocobalamin ([employer]S VITAMIN B12) 1000 MCG tablet Take by mouth.    [provider]  fluticasone (FLONASE) 50 MCG/ACT nasal spray 1 spray by Both Nostrils route daily.    [provider]  Ginkgo Biloba (GINKOBA PO) Take 1 capsule by mouth daily.    [provider]  ibuprofen (ADVIL) 200 MG tablet Take by mouth.    [provider]  Multiple Vitamin (MULTIVITAMIN WITH MINERALS) TABS Take 1 tablet by mouth daily.    [provider]  Omega-3 Fatty Acids (FISH OIL) 1000 MG CAPS Take by mouth.    [provider]      Allergies    Patient has no known allergies.    Review of Systems   Review of Systems  Constitutional:  Negative for fever.  HENT:  Positive for rhinorrhea. Negative for sore throat.   Eyes:  Negative  for redness.  Respiratory:  Positive for cough. Negative for shortness of breath.   Cardiovascular:  Negative for chest pain and leg swelling.  Gastrointestinal:  Negative for abdominal pain, blood in stool, diarrhea and vomiting.  Genitourinary:  Negative for dysuria and flank pain.  Musculoskeletal:  Negative for back pain, neck pain and neck stiffness.  Skin:  Negative for rash.  Neurological:  Negative for headaches.  Hematological:  Does not bruise/bleed easily.  Psychiatric/Behavioral:  Negative for confusion.     Physical Exam Updated Vital Signs BP 129/81   Pulse 86   Temp 98.6 F (37 C) (Temporal)   Resp 16   Ht 1.803 m (5\' 11" )   Wt 89.8 kg   SpO2 95%   BMI 27.61 kg/m  Physical Exam Vitals and nursing note reviewed.  Constitutional:      Appearance: Normal appearance. He is well-developed.  HENT:     Head: Atraumatic.     Nose: Congestion present.     Mouth/Throat:     Mouth: Mucous membranes are moist.     Pharynx: Oropharynx is clear.  Eyes:     General: No scleral icterus.    Conjunctiva/sclera: Conjunctivae normal.     Pupils: Pupils are equal, round, and reactive to light.  Neck:     Vascular: No carotid bruit.     Trachea: No tracheal deviation.  Comments: No stiffness or rigidity.  Cardiovascular:     Rate and Rhythm: Regular rhythm. Tachycardia present.     Pulses: Normal pulses.     Heart sounds: Normal heart sounds. No murmur heard.    No friction rub. No gallop.  Pulmonary:     Effort: Pulmonary effort is normal. No accessory muscle usage or respiratory distress.     Breath sounds: Normal breath sounds.  Abdominal:     General: Bowel sounds are normal. There is no distension.     Palpations: Abdomen is soft. There is no mass.     Tenderness: There is no abdominal tenderness. There is no guarding.  Genitourinary:    Comments: No cva tenderness. Musculoskeletal:        General: No swelling or tenderness.     Cervical back: Normal range  of motion and neck supple. No rigidity.     Right lower leg: No edema.     Left lower leg: No edema.  Skin:    General: Skin is warm and dry.     Findings: No rash.  Neurological:     Mental Status: He is alert.     Comments: Alert, speech clear. Motor/sens grossly intact bil. Steady gait.   Psychiatric:        Mood and Affect: Mood normal.     ED Results / Procedures / Treatments   Labs (all labs ordered are listed, but only abnormal results are displayed) Results for orders placed or performed during the hospital encounter of 09/05/22  SARS Coronavirus 2 by RT PCR (hospital order, performed in [facility] hospital lab) *cepheid single result test* Anterior Nasal Swab   Specimen: Anterior Nasal Swab  Result Value Ref Range   SARS Coronavirus 2 by RT PCR NEGATIVE NEGATIVE  Basic metabolic panel  Result Value Ref Range   Sodium 135 135 - 145 mmol/L   Potassium 3.7 3.5 - 5.1 mmol/L   Chloride 101 98 - 111 mmol/L   CO2 19 (L) 22 - 32 mmol/L   Glucose, Bld 141 (H) 70 - 99 mg/dL   BUN 17 8 - 23 mg/dL   Creatinine, Ser 4.09 (H) 0.61 - 1.24 mg/dL   Calcium 8.7 (L) 8.9 - 10.3 mg/dL   GFR, Estimated 59 (L) >60 mL/min   Anion gap 15 5 - 15  CBC  Result Value Ref Range   WBC 6.9 4.0 - 10.5 K/uL   RBC 4.78 4.22 - 5.81 MIL/uL   Hemoglobin 14.7 13.0 - 17.0 g/dL   HCT 81.1 91.4 - 78.2 %   MCV 90.2 80.0 - 100.0 fL   MCH 30.8 26.0 - 34.0 pg   MCHC 34.1 30.0 - 36.0 g/dL   RDW 95.6 21.3 - 08.6 %   Platelets 205 150 - 400 K/uL   nRBC 0.0 0.0 - 0.2 %  Urinalysis, Routine w reflex microscopic -Urine, Clean Catch  Result Value Ref Range   Color, Urine YELLOW YELLOW   APPearance CLEAR CLEAR   Specific Gravity, Urine 1.009 1.005 - 1.030   pH 6.5 5.0 - 8.0   Glucose, UA NEGATIVE NEGATIVE mg/dL   Hgb urine dipstick NEGATIVE NEGATIVE   Bilirubin Urine NEGATIVE NEGATIVE   Ketones, ur NEGATIVE NEGATIVE mg/dL   Protein, ur NEGATIVE NEGATIVE mg/dL   Nitrite NEGATIVE NEGATIVE   Leukocytes,Ua  NEGATIVE NEGATIVE  Troponin I (High Sensitivity)  Result Value Ref Range   Troponin I (High Sensitivity) 8 <18 ng/L     EKG EKG Interpretation  Date/Time:  Wednesday September 05 2022 18:06:41 EDT Ventricular Rate:  117 PR Interval:  154 QRS Duration:  74 QT Interval:  304 QTC Calculation: 424 R Axis:   218  Text Interpretation: Sinus tachycardia Low voltage QRS Confirmed by Cathren Laine ([id number]) on 09/05/2022 6:13:28 PM  Radiology DG Chest 2 View  Result Date: 09/05/2022 CLINICAL DATA:  Chest pain and tachycardia. Decreased level of energy. Fatigue. EXAM: CHEST - 2 VIEW COMPARISON:  None Available. FINDINGS: The heart size and mediastinal contours are within normal limits. Both lungs are clear. The visualized skeletal structures are unremarkable. IMPRESSION: No active cardiopulmonary disease. Electronically Signed   By: Burman Nieves M.D.   On: 09/05/2022 19:10    Procedures Procedures    Medications Ordered in ED Medications  lactated ringers bolus 1,000 mL (0 mLs Intravenous Stopped 09/05/22 2104)    ED Course/ Medical Decision Making/ A&P                                 Medical Decision Making Problems Addressed: Dehydration: acute illness or injury with systemic symptoms that poses a threat to life or bodily functions Generalized weakness: acute illness or injury with systemic symptoms that poses a threat to life or bodily functions  Amount and/or Complexity of Data Reviewed External Data Reviewed: notes. Labs: ordered. Decision-making details documented in ED Course. Radiology: ordered and independent interpretation performed. Decision-making details documented in ED Course. ECG/medicine tests: ordered and independent interpretation performed. Decision-making details documented in ED Course.  Risk Decision regarding hospitalization.   Iv ns. Continuous pulse ox and cardiac monitoring. Labs ordered/sent. Imaging ordered.   Differential diagnosis includes covid,  dehydration, aki, etc. Dispo decision including potential need for admission considered - will get labs and imaging and reassess.   Reviewed nursing notes and prior charts for additional history. External reports reviewed.   Cardiac monitor: sinus rhythm, rate 110.  Labs reviewed/interpreted by me - trop normal. Chem normal x hco3 sl low ?dehydration. LR bolus. Wbc and hgb normal.   Xrays reviewed/interpreted by me - no pna.  Recheck, vitals normal. No distress. Has eaten food/drinks. No current pain or discomfort. No cp or sob. No abd pain or nv.   Pt currently appears stable for d/c.   Rec close pcp f/u.  Return precautions provided.          Final Clinical Impression(s) / ED Diagnoses Final diagnoses:  Generalized weakness  Dehydration    Rx / DC Orders ED Discharge Orders     None         Cathren Laine, MD 09/05/22 2246

## 2022-09-05 NOTE — ED Notes (Signed)
 RN reviewed discharge instructions with pt. Pt verbalized understanding and had no further questions. VSS upon discharge.  

## 2022-09-05 NOTE — ED Triage Notes (Signed)
"  I feel like my energy level has dropped". This started on Monday (after Vaccines, COVID19 and Shingles). No fever. Occasional sniffles "but I have allergies". No cough. No skin reaction to vaccines. "A few people at the facility where my wife is has COVID19".

## 2022-09-05 NOTE — ED Provider Notes (Signed)
Patient presents today with decreased energy level after getting 3 vaccinations at the beginning of the week.  He states he has had some runny nose and sniffles but also has allergies at baseline.  Wife is currently in a rehab facility-patient states a few people there have had COVID.  Recommended further evaluation in the emergency room given tachycardia on exam as I suspect patient needs stat labs.  No acute findings noted on EKG, patient will transport via POV.   Tomi Bamberger, PA-C 09/05/22 1740

## 2023-11-18 ENCOUNTER — Ambulatory Visit
Admission: EM | Admit: 2023-11-18 | Discharge: 2023-11-18 | Disposition: A | Discharge status: Home / Self Care | Attending: Family Medicine | Admitting: Family Medicine

## 2023-11-18 ENCOUNTER — Encounter: Payer: Self-pay | Admitting: Emergency Medicine

## 2023-11-18 DIAGNOSIS — J069 Acute upper respiratory infection, unspecified: Secondary | ICD-10-CM

## 2023-11-18 MED ORDER — BENZONATATE 200 MG PO CAPS: 200.0000 mg | ORAL_CAPSULE | Freq: Three times a day (TID) | ORAL | 0 refills | Status: AC | PRN

## 2023-11-18 NOTE — Discharge Instructions (Signed)
 You were seen today for upper respiratory symptoms.  This appears to be viral at this time.  I have sent out a cough medication to help with symptoms.  I recommend rest and fluids.   Please return if not improving or worsening despite treatment.

## 2023-11-18 NOTE — ED Triage Notes (Signed)
 Pt reports sore throat, nasal congestion, and productive cough x5 days. Has been taking mucinex with some relief. Denies fevers, chills, and body aches. No sick contacts. No testing completed at home.

## 2023-11-18 NOTE — ED Provider Notes (Signed)
 [facility]    CSN: [id number] Arrival date & time: 11/18/23  1400      History   Chief Complaint Chief Complaint  Patient presents with   Cough   Sore Throat   Nasal Congestion    HPI Kenneth Mccarthy is a 68 y.o. male.    Cough Associated symptoms: rhinorrhea   Sore Throat  Patient is  here for URI symptoms x 5 days.  Having a sore throat, cough, congestion.  No fevers/chills.  No body aches.  No n/v.  No wheezing or sob.  Taking otc meds with some help.        Past Medical History:  Diagnosis Date   Hernia    does not know where. 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 No pertinent surgical history.     Home Medications    Prior to Admission medications   Medication Sig Start Date End Date Taking? Authorizing Provider  cyanocobalamin ([employer] VITAMIN B12) 1000 MCG tablet Take by mouth.   Yes [provider]  fluticasone (FLONASE) 50 MCG/ACT nasal spray 1 spray by Both Nostrils route daily.   Yes [provider]  Ginkgo Biloba (GINKOBA PO) Take 1 capsule by mouth daily.   Yes [provider]  ibuprofen (ADVIL) 200 MG tablet Take by mouth.   Yes [provider]  Multiple Vitamin (MULTIVITAMIN WITH MINERALS) TABS Take 1 tablet by mouth daily.   Yes [provider]  Ascorbic Acid 1500 MG TBCR Take by mouth. Patient not taking: Reported on 11/18/2023    [provider]  bismuth subsalicylate (PEPTO BISMOL) 262 MG/15ML suspension Take 30 mLs by mouth every 6 (six) hours as needed for indigestion. Patient not taking: Reported on 11/18/2023    [provider]  Omega-3 Fatty Acids (FISH OIL) 1000 MG CAPS Take by mouth. Patient not taking: Reported on 11/18/2023     [provider]    Family History History reviewed. No pertinent family history.  Social History Social History   Tobacco Use   Smoking status: Never   Smokeless tobacco: Never  Vaping Use   Vaping status: Never Used  Substance Use Topics   Alcohol use: No   Drug use: No     Allergies   Patient has no known allergies.   Review of Systems Review of Systems  Constitutional: Negative.   HENT:  Positive for congestion and rhinorrhea.   Respiratory:  Positive for cough.   Gastrointestinal: Negative.   Genitourinary: Negative.   Musculoskeletal: Negative.   Psychiatric/Behavioral: Negative.       Physical Exam Triage Vital Signs ED Triage Vitals [11/18/23 1428]  Encounter Vitals Group     BP 133/83     Girls Systolic BP Percentile      Girls Diastolic BP Percentile      Boys Systolic BP Percentile      Boys Diastolic BP Percentile      Pulse Rate 80     Resp 18     Temp 97.6 F (36.4 C)     Temp Source Oral     SpO2 94 %     Weight      Height      Head Circumference      Peak  Flow      Pain Score 0     Pain Loc      Pain Education      Exclude from Growth Chart    No data found.  Updated Vital Signs BP 133/83 (BP Location: Left Arm)   Pulse 80   Temp 97.6 F (36.4 C) (Oral)   Resp 18   SpO2 94%   Visual Acuity Right Eye Distance:   Left Eye Distance:   Bilateral Distance:    Right Eye Near:   Left Eye Near:    Bilateral Near:     Physical Exam Constitutional:      General: He is not in acute distress.    Appearance: He is well-developed and normal weight. He is not ill-appearing or toxic-appearing.  HENT:     Nose: No congestion or rhinorrhea.     Mouth/Throat:     Mouth: Mucous membranes are moist.     Pharynx: No oropharyngeal exudate or posterior oropharyngeal erythema.  Cardiovascular:     Rate and Rhythm: Normal rate and regular rhythm.     Heart sounds: Normal heart sounds.  Pulmonary:     Effort: Pulmonary effort  is normal.     Breath sounds: Normal breath sounds. No wheezing, rhonchi or rales.  Musculoskeletal:     Cervical back: Normal range of motion and neck supple.  Neurological:     General: No focal deficit present.     Mental Status: He is alert.  Psychiatric:        Mood and Affect: Mood normal.      UC Treatments / Results  Labs (all labs ordered are listed, but only abnormal results are displayed) Labs Reviewed - No data to display  EKG   Radiology No results found.  Procedures Procedures (including critical care time)  Medications Ordered in UC Medications - No data to display  Initial Impression / Assessment and Plan / UC Course  I have reviewed the triage vital signs and the nursing notes.  Pertinent labs & imaging results that were available during my care of the patient were reviewed by me and considered in my medical decision making (see chart for details).   Final Clinical Impressions(s) / UC Diagnoses   Final diagnoses:  Viral upper respiratory infection     Discharge Instructions      You were seen today for upper respiratory symptoms.  This appears to be viral at this time.  I have sent out a cough medication to help with symptoms.  I recommend rest and fluids.   Please return if not improving or worsening despite treatment.     ED Prescriptions     Medication Sig Dispense Auth. Provider   benzonatate (TESSALON) 200 MG capsule Take 1 capsule (200 mg total) by mouth 3 (three) times daily as needed for cough. 21 capsule Darral Longs, MD      PDMP not reviewed this encounter.   Darral Longs, MD 11/18/23 [id number]
